# Patient Record
Sex: Male | Born: 1960 | Race: White | Hispanic: No | Marital: Married
Health system: Southern US, Community
[De-identification: ages and names within clinical notes are randomized; demographics above are authoritative.]

## PROBLEM LIST (undated history)

## (undated) DIAGNOSIS — I10 Essential (primary) hypertension: Secondary | ICD-10-CM

## (undated) DIAGNOSIS — M109 Gout, unspecified: Secondary | ICD-10-CM

---

## 2015-12-20 DIAGNOSIS — I10 Essential (primary) hypertension: Secondary | ICD-10-CM | POA: Diagnosis not present

## 2016-02-14 DIAGNOSIS — M25561 Pain in right knee: Secondary | ICD-10-CM | POA: Diagnosis not present

## 2016-02-14 DIAGNOSIS — M25562 Pain in left knee: Secondary | ICD-10-CM | POA: Diagnosis not present

## 2016-02-14 DIAGNOSIS — M25461 Effusion, right knee: Secondary | ICD-10-CM | POA: Diagnosis not present

## 2016-05-05 DIAGNOSIS — M25561 Pain in right knee: Secondary | ICD-10-CM | POA: Diagnosis not present

## 2016-05-05 DIAGNOSIS — M25461 Effusion, right knee: Secondary | ICD-10-CM | POA: Diagnosis not present

## 2016-07-14 DIAGNOSIS — Z0000 Encounter for general adult medical examination without abnormal findings: Secondary | ICD-10-CM | POA: Diagnosis not present

## 2016-07-14 DIAGNOSIS — Z125 Encounter for screening for malignant neoplasm of prostate: Secondary | ICD-10-CM | POA: Diagnosis not present

## 2016-07-14 DIAGNOSIS — E119 Type 2 diabetes mellitus without complications: Secondary | ICD-10-CM | POA: Diagnosis not present

## 2016-07-17 DIAGNOSIS — C4491 Basal cell carcinoma of skin, unspecified: Secondary | ICD-10-CM | POA: Diagnosis not present

## 2016-07-17 DIAGNOSIS — Z23 Encounter for immunization: Secondary | ICD-10-CM | POA: Diagnosis not present

## 2016-07-17 DIAGNOSIS — E119 Type 2 diabetes mellitus without complications: Secondary | ICD-10-CM | POA: Diagnosis not present

## 2016-07-17 DIAGNOSIS — I10 Essential (primary) hypertension: Secondary | ICD-10-CM | POA: Diagnosis not present

## 2016-07-17 DIAGNOSIS — Z0001 Encounter for general adult medical examination with abnormal findings: Secondary | ICD-10-CM | POA: Diagnosis not present

## 2016-07-28 DIAGNOSIS — Z1211 Encounter for screening for malignant neoplasm of colon: Secondary | ICD-10-CM | POA: Diagnosis not present

## 2016-07-29 DIAGNOSIS — M79672 Pain in left foot: Secondary | ICD-10-CM | POA: Diagnosis not present

## 2016-07-29 DIAGNOSIS — M25521 Pain in right elbow: Secondary | ICD-10-CM | POA: Diagnosis not present

## 2016-08-04 DIAGNOSIS — M7732 Calcaneal spur, left foot: Secondary | ICD-10-CM | POA: Diagnosis not present

## 2016-08-04 DIAGNOSIS — M79672 Pain in left foot: Secondary | ICD-10-CM | POA: Diagnosis not present

## 2016-08-08 DIAGNOSIS — K635 Polyp of colon: Secondary | ICD-10-CM | POA: Diagnosis not present

## 2016-08-08 DIAGNOSIS — Z1211 Encounter for screening for malignant neoplasm of colon: Secondary | ICD-10-CM | POA: Diagnosis not present

## 2016-08-12 DIAGNOSIS — K635 Polyp of colon: Secondary | ICD-10-CM | POA: Diagnosis not present

## 2017-02-17 ENCOUNTER — Emergency Department (HOSPITAL_COMMUNITY): Payer: BLUE CROSS/BLUE SHIELD

## 2017-02-17 ENCOUNTER — Other Ambulatory Visit: Payer: Self-pay

## 2017-02-17 ENCOUNTER — Encounter (HOSPITAL_COMMUNITY): Payer: Self-pay | Admitting: Emergency Medicine

## 2017-02-17 ENCOUNTER — Emergency Department (HOSPITAL_COMMUNITY)
Admission: EM | Admit: 2017-02-17 | Discharge: 2017-02-17 | Disposition: A | Discharge status: Home / Self Care | Payer: BLUE CROSS/BLUE SHIELD | Attending: Emergency Medicine | Admitting: Emergency Medicine

## 2017-02-17 DIAGNOSIS — Y9389 Activity, other specified: Secondary | ICD-10-CM | POA: Insufficient documentation

## 2017-02-17 DIAGNOSIS — Y998 Other external cause status: Secondary | ICD-10-CM | POA: Insufficient documentation

## 2017-02-17 DIAGNOSIS — S41111A Laceration without foreign body of right upper arm, initial encounter: Secondary | ICD-10-CM | POA: Diagnosis not present

## 2017-02-17 DIAGNOSIS — Y929 Unspecified place or not applicable: Secondary | ICD-10-CM | POA: Diagnosis not present

## 2017-02-17 DIAGNOSIS — S51811A Laceration without foreign body of right forearm, initial encounter: Secondary | ICD-10-CM | POA: Diagnosis not present

## 2017-02-17 HISTORY — DX: Gout, unspecified: M10.9

## 2017-02-17 HISTORY — DX: Essential (primary) hypertension: I10

## 2017-02-17 LAB — CBC WITH DIFFERENTIAL/PLATELET
BASOS PCT: 0 %
Basophils Absolute: 0 10*3/uL (ref 0.0–0.1)
EOS ABS: 0.3 10*3/uL (ref 0.0–0.7)
Eosinophils Relative: 2 %
HCT: 35.2 % — ABNORMAL LOW (ref 39.0–52.0)
Hemoglobin: 11.4 g/dL — ABNORMAL LOW (ref 13.0–17.0)
Lymphocytes Relative: 17 %
Lymphs Abs: 2.1 10*3/uL (ref 0.7–4.0)
MCH: 27.9 pg (ref 26.0–34.0)
MCHC: 32.4 g/dL (ref 30.0–36.0)
MCV: 86.3 fL (ref 78.0–100.0)
MONO ABS: 0.7 10*3/uL (ref 0.1–1.0)
MONOS PCT: 6 %
Neutro Abs: 9.6 10*3/uL — ABNORMAL HIGH (ref 1.7–7.7)
Neutrophils Relative %: 75 %
Platelets: 250 10*3/uL (ref 150–400)
RBC: 4.08 MIL/uL — ABNORMAL LOW (ref 4.22–5.81)
RDW: 14 % (ref 11.5–15.5)
WBC: 12.8 10*3/uL — ABNORMAL HIGH (ref 4.0–10.5)

## 2017-02-17 LAB — BASIC METABOLIC PANEL
Anion gap: 11 (ref 5–15)
BUN: 20 mg/dL (ref 6–20)
CALCIUM: 8.2 mg/dL — AB (ref 8.9–10.3)
CO2: 17 mmol/L — AB (ref 22–32)
CREATININE: 1.32 mg/dL — AB (ref 0.61–1.24)
Chloride: 106 mmol/L (ref 101–111)
GFR calc non Af Amer: 59 mL/min — ABNORMAL LOW (ref >60)
Glucose, Bld: 346 mg/dL — ABNORMAL HIGH (ref 65–99)
Potassium: 3.8 mmol/L (ref 3.5–5.1)
Sodium: 134 mmol/L — ABNORMAL LOW (ref 135–145)

## 2017-02-17 LAB — I-STAT CHEM 8, ED
BUN: 23 mg/dL — AB (ref 6–20)
CALCIUM ION: 1.21 mmol/L (ref 1.15–1.40)
CHLORIDE: 102 mmol/L (ref 101–111)
Creatinine, Ser: 1.3 mg/dL — ABNORMAL HIGH (ref 0.61–1.24)
Glucose, Bld: 356 mg/dL — ABNORMAL HIGH (ref 65–99)
HEMATOCRIT: 34 % — AB (ref 39.0–52.0)
Hemoglobin: 11.6 g/dL — ABNORMAL LOW (ref 13.0–17.0)
Potassium: 3.8 mmol/L (ref 3.5–5.1)
SODIUM: 138 mmol/L (ref 135–145)
TCO2: 24 mmol/L (ref 22–32)

## 2017-02-17 LAB — HEMOGLOBIN AND HEMATOCRIT, BLOOD
HCT: 32.3 % — ABNORMAL LOW (ref 39.0–52.0)
HEMOGLOBIN: 10.5 g/dL — AB (ref 13.0–17.0)

## 2017-02-17 LAB — PROTIME-INR
INR: 0.97
Prothrombin Time: 12.8 seconds (ref 11.4–15.2)

## 2017-02-17 LAB — TYPE AND SCREEN
ABO/RH(D): A POS
Antibody Screen: NEGATIVE

## 2017-02-17 LAB — ABO/RH: ABO/RH(D): A POS

## 2017-02-17 MED ORDER — HYDROCODONE-ACETAMINOPHEN 5-325 MG PO TABS: 2.0000 | ORAL_TABLET | ORAL | 0 refills | Status: AC | PRN

## 2017-02-17 MED ORDER — SODIUM CHLORIDE 0.9 % IV SOLN
INTRAVENOUS | Status: AC | PRN
  Administered 2017-02-17: 10 mL/h via INTRAVENOUS

## 2017-02-17 MED ORDER — FENTANYL CITRATE (PF) 100 MCG/2ML IJ SOLN
50.0000 ug | Freq: Once | INTRAMUSCULAR | Status: AC
  Administered 2017-02-17: 50 ug via INTRAVENOUS
  Filled 2017-02-17: qty 2

## 2017-02-17 MED ORDER — CEFAZOLIN SODIUM-DEXTROSE 2-4 GM/100ML-% IV SOLN
2.0000 g | Freq: Once | INTRAVENOUS | Status: AC
  Administered 2017-02-17: 2 g via INTRAVENOUS
  Filled 2017-02-17: qty 100

## 2017-02-17 MED ORDER — CLINDAMYCIN HCL 300 MG PO CAPS: 300.0000 mg | ORAL_CAPSULE | Freq: Three times a day (TID) | ORAL | 0 refills | Status: AC

## 2017-02-17 MED ORDER — IOPAMIDOL (ISOVUE-370) INJECTION 76%
INTRAVENOUS | Status: AC
  Administered 2017-02-17: 100 mL
  Filled 2017-02-17: qty 100

## 2017-02-17 MED ORDER — OXYCODONE-ACETAMINOPHEN 5-325 MG PO TABS
2.0000 | ORAL_TABLET | Freq: Once | ORAL | Status: AC
  Administered 2017-02-17: 2 via ORAL
  Filled 2017-02-17: qty 2

## 2017-02-17 MED ORDER — LIDOCAINE-EPINEPHRINE (PF) 2 %-1:200000 IJ SOLN
20.0000 mL | Freq: Once | INTRAMUSCULAR | Status: AC
  Administered 2017-02-17: 20 mL
  Filled 2017-02-17: qty 20

## 2017-02-17 NOTE — ED Notes (Signed)
Patient transported to CT 
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
## 2017-02-17 NOTE — ED Notes (Signed)
Family at beside. Family given emotional support. 

## 2017-02-17 NOTE — ED Notes (Signed)
[employer] at bedside 

## 2017-02-17 NOTE — Discharge Instructions (Signed)
Follow up with Dr. Ave Filterhandler for suture removal in 1 week. You likely need to have repair of your biceps muscle. Return to the ED if you develop new or worsening symptoms.

## 2017-02-17 NOTE — ED Notes (Signed)
US IV access attempted x2 by this RN and X2 by Dr. Manus Gunningancour. Unsuccessful. IV team consulted placed.

## 2017-02-17 NOTE — ED Notes (Signed)
Given paper scrubs, socks, coke. Tolerating well. Taken to car in w/c.

## 2017-02-17 NOTE — ED Notes (Signed)
MD at bedside to suture.
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
## 2017-02-17 NOTE — ED Notes (Signed)
Pt next to go to CT.

## 2017-02-23 DIAGNOSIS — M25521 Pain in right elbow: Secondary | ICD-10-CM | POA: Diagnosis not present

## 2017-03-03 DIAGNOSIS — M25521 Pain in right elbow: Secondary | ICD-10-CM | POA: Diagnosis not present

## 2017-03-07 DIAGNOSIS — 419620001 Death: Secondary | SNOMED CT | POA: Diagnosis not present

## 2017-03-07 DEATH — deceased

## 2017-03-24 DIAGNOSIS — M25521 Pain in right elbow: Secondary | ICD-10-CM | POA: Diagnosis not present

## 2017-04-21 DIAGNOSIS — M25521 Pain in right elbow: Secondary | ICD-10-CM | POA: Diagnosis not present

## 2017-07-16 DIAGNOSIS — Z125 Encounter for screening for malignant neoplasm of prostate: Secondary | ICD-10-CM | POA: Diagnosis not present

## 2017-07-16 DIAGNOSIS — E119 Type 2 diabetes mellitus without complications: Secondary | ICD-10-CM | POA: Diagnosis not present

## 2017-07-16 DIAGNOSIS — Z0000 Encounter for general adult medical examination without abnormal findings: Secondary | ICD-10-CM | POA: Diagnosis not present

## 2017-07-30 DIAGNOSIS — I10 Essential (primary) hypertension: Secondary | ICD-10-CM | POA: Diagnosis not present

## 2017-07-30 DIAGNOSIS — Z0001 Encounter for general adult medical examination with abnormal findings: Secondary | ICD-10-CM | POA: Diagnosis not present

## 2017-07-30 DIAGNOSIS — E119 Type 2 diabetes mellitus without complications: Secondary | ICD-10-CM | POA: Diagnosis not present

## 2017-07-30 DIAGNOSIS — C4491 Basal cell carcinoma of skin, unspecified: Secondary | ICD-10-CM | POA: Diagnosis not present

## 2017-10-22 DIAGNOSIS — I10 Essential (primary) hypertension: Secondary | ICD-10-CM | POA: Diagnosis not present

## 2017-10-22 DIAGNOSIS — E119 Type 2 diabetes mellitus without complications: Secondary | ICD-10-CM | POA: Diagnosis not present

## 2017-10-29 DIAGNOSIS — I10 Essential (primary) hypertension: Secondary | ICD-10-CM | POA: Diagnosis not present

## 2017-10-29 DIAGNOSIS — E119 Type 2 diabetes mellitus without complications: Secondary | ICD-10-CM | POA: Diagnosis not present

## 2017-11-09 DIAGNOSIS — E119 Type 2 diabetes mellitus without complications: Secondary | ICD-10-CM | POA: Diagnosis not present

## 2017-11-09 DIAGNOSIS — Z6836 Body mass index (BMI) 36.0-36.9, adult: Secondary | ICD-10-CM | POA: Diagnosis not present

## 2017-11-09 DIAGNOSIS — I10 Essential (primary) hypertension: Secondary | ICD-10-CM | POA: Diagnosis not present

## 2018-10-28 DIAGNOSIS — I10 Essential (primary) hypertension: Secondary | ICD-10-CM | POA: Diagnosis not present

## 2018-10-28 DIAGNOSIS — E559 Vitamin D deficiency, unspecified: Secondary | ICD-10-CM | POA: Diagnosis not present

## 2018-10-28 DIAGNOSIS — E119 Type 2 diabetes mellitus without complications: Secondary | ICD-10-CM | POA: Diagnosis not present

## 2018-11-04 DIAGNOSIS — R1310 Dysphagia, unspecified: Secondary | ICD-10-CM | POA: Diagnosis not present

## 2018-11-04 DIAGNOSIS — E118 Type 2 diabetes mellitus with unspecified complications: Secondary | ICD-10-CM | POA: Diagnosis not present

## 2018-11-11 DIAGNOSIS — Z1159 Encounter for screening for other viral diseases: Secondary | ICD-10-CM | POA: Diagnosis not present

## 2018-11-11 DIAGNOSIS — R1310 Dysphagia, unspecified: Secondary | ICD-10-CM | POA: Diagnosis not present

## 2018-12-16 DIAGNOSIS — R1310 Dysphagia, unspecified: Secondary | ICD-10-CM | POA: Diagnosis not present

## 2018-12-16 DIAGNOSIS — Z01818 Encounter for other preprocedural examination: Secondary | ICD-10-CM | POA: Diagnosis not present

## 2018-12-16 DIAGNOSIS — E119 Type 2 diabetes mellitus without complications: Secondary | ICD-10-CM | POA: Diagnosis not present

## 2018-12-22 DIAGNOSIS — K2970 Gastritis, unspecified, without bleeding: Secondary | ICD-10-CM | POA: Diagnosis not present

## 2019-01-12 DIAGNOSIS — K209 Esophagitis, unspecified without bleeding: Secondary | ICD-10-CM | POA: Diagnosis not present

## 2019-01-12 DIAGNOSIS — K222 Esophageal obstruction: Secondary | ICD-10-CM | POA: Diagnosis not present

## 2019-04-05 DIAGNOSIS — E119 Type 2 diabetes mellitus without complications: Secondary | ICD-10-CM | POA: Diagnosis not present

## 2019-04-05 DIAGNOSIS — Z0000 Encounter for general adult medical examination without abnormal findings: Secondary | ICD-10-CM | POA: Diagnosis not present

## 2019-04-05 DIAGNOSIS — I10 Essential (primary) hypertension: Secondary | ICD-10-CM | POA: Diagnosis not present

## 2019-04-05 DIAGNOSIS — Z125 Encounter for screening for malignant neoplasm of prostate: Secondary | ICD-10-CM | POA: Diagnosis not present

## 2019-04-05 DIAGNOSIS — Z1159 Encounter for screening for other viral diseases: Secondary | ICD-10-CM | POA: Diagnosis not present

## 2019-04-13 DIAGNOSIS — I10 Essential (primary) hypertension: Secondary | ICD-10-CM | POA: Diagnosis not present

## 2019-04-13 DIAGNOSIS — Z1212 Encounter for screening for malignant neoplasm of rectum: Secondary | ICD-10-CM | POA: Diagnosis not present

## 2019-04-13 DIAGNOSIS — Z0001 Encounter for general adult medical examination with abnormal findings: Secondary | ICD-10-CM | POA: Diagnosis not present

## 2019-12-16 DIAGNOSIS — M109 Gout, unspecified: Secondary | ICD-10-CM | POA: Diagnosis not present

## 2020-04-11 DIAGNOSIS — Z125 Encounter for screening for malignant neoplasm of prostate: Secondary | ICD-10-CM | POA: Diagnosis not present

## 2020-04-11 DIAGNOSIS — I10 Essential (primary) hypertension: Secondary | ICD-10-CM | POA: Diagnosis not present

## 2020-04-11 DIAGNOSIS — Z0000 Encounter for general adult medical examination without abnormal findings: Secondary | ICD-10-CM | POA: Diagnosis not present

## 2020-04-16 DIAGNOSIS — G43009 Migraine without aura, not intractable, without status migrainosus: Secondary | ICD-10-CM | POA: Diagnosis not present

## 2020-04-16 DIAGNOSIS — Z23 Encounter for immunization: Secondary | ICD-10-CM | POA: Diagnosis not present

## 2020-04-16 DIAGNOSIS — I10 Essential (primary) hypertension: Secondary | ICD-10-CM | POA: Diagnosis not present

## 2020-04-16 DIAGNOSIS — E118 Type 2 diabetes mellitus with unspecified complications: Secondary | ICD-10-CM | POA: Diagnosis not present

## 2020-04-16 DIAGNOSIS — Z0001 Encounter for general adult medical examination with abnormal findings: Secondary | ICD-10-CM | POA: Diagnosis not present

## 2020-05-08 DIAGNOSIS — E119 Type 2 diabetes mellitus without complications: Secondary | ICD-10-CM | POA: Diagnosis not present

## 2020-05-08 DIAGNOSIS — I10 Essential (primary) hypertension: Secondary | ICD-10-CM | POA: Diagnosis not present

## 2020-05-08 DIAGNOSIS — E7800 Pure hypercholesterolemia, unspecified: Secondary | ICD-10-CM | POA: Diagnosis not present

## 2020-08-07 DIAGNOSIS — I10 Essential (primary) hypertension: Secondary | ICD-10-CM | POA: Diagnosis not present

## 2020-08-08 DIAGNOSIS — E7800 Pure hypercholesterolemia, unspecified: Secondary | ICD-10-CM | POA: Diagnosis not present

## 2020-08-08 DIAGNOSIS — E119 Type 2 diabetes mellitus without complications: Secondary | ICD-10-CM | POA: Diagnosis not present

## 2020-08-09 DIAGNOSIS — E119 Type 2 diabetes mellitus without complications: Secondary | ICD-10-CM | POA: Diagnosis not present

## 2020-08-09 DIAGNOSIS — E7800 Pure hypercholesterolemia, unspecified: Secondary | ICD-10-CM | POA: Diagnosis not present

## 2020-08-09 DIAGNOSIS — I10 Essential (primary) hypertension: Secondary | ICD-10-CM | POA: Diagnosis not present

## 2020-09-25 DIAGNOSIS — H18413 Arcus senilis, bilateral: Secondary | ICD-10-CM | POA: Diagnosis not present

## 2020-09-25 DIAGNOSIS — H25013 Cortical age-related cataract, bilateral: Secondary | ICD-10-CM | POA: Diagnosis not present

## 2020-09-25 DIAGNOSIS — H2511 Age-related nuclear cataract, right eye: Secondary | ICD-10-CM | POA: Diagnosis not present

## 2020-09-25 DIAGNOSIS — H25043 Posterior subcapsular polar age-related cataract, bilateral: Secondary | ICD-10-CM | POA: Diagnosis not present

## 2020-09-25 DIAGNOSIS — H2513 Age-related nuclear cataract, bilateral: Secondary | ICD-10-CM | POA: Diagnosis not present

## 2020-11-14 DIAGNOSIS — H2511 Age-related nuclear cataract, right eye: Secondary | ICD-10-CM | POA: Diagnosis not present

## 2020-11-15 DIAGNOSIS — H2512 Age-related nuclear cataract, left eye: Secondary | ICD-10-CM | POA: Diagnosis not present

## 2020-11-28 DIAGNOSIS — H2512 Age-related nuclear cataract, left eye: Secondary | ICD-10-CM | POA: Diagnosis not present

## 2020-11-28 DIAGNOSIS — H2513 Age-related nuclear cataract, bilateral: Secondary | ICD-10-CM | POA: Diagnosis not present

## 2021-04-22 ENCOUNTER — Other Ambulatory Visit (HOSPITAL_COMMUNITY): Payer: Self-pay

## 2021-04-22 DIAGNOSIS — E119 Type 2 diabetes mellitus without complications: Secondary | ICD-10-CM | POA: Diagnosis not present

## 2021-04-22 DIAGNOSIS — Z125 Encounter for screening for malignant neoplasm of prostate: Secondary | ICD-10-CM | POA: Diagnosis not present

## 2021-04-22 DIAGNOSIS — Z0000 Encounter for general adult medical examination without abnormal findings: Secondary | ICD-10-CM | POA: Diagnosis not present

## 2021-04-22 MED ORDER — OZEMPIC (0.25 OR 0.5 MG/DOSE) 2 MG/1.5ML ~~LOC~~ SOPN
PEN_INJECTOR | SUBCUTANEOUS | 5 refills | Status: DC
  Filled 2021-04-22: qty 1.5, 28d supply, fill #0
  Filled 2021-06-13: qty 1.5, 28d supply, fill #1
  Filled 2021-07-23: qty 1.5, 28d supply, fill #2

## 2021-04-23 DIAGNOSIS — E7800 Pure hypercholesterolemia, unspecified: Secondary | ICD-10-CM | POA: Diagnosis not present

## 2021-04-23 DIAGNOSIS — Z0000 Encounter for general adult medical examination without abnormal findings: Secondary | ICD-10-CM | POA: Diagnosis not present

## 2021-04-23 DIAGNOSIS — E118 Type 2 diabetes mellitus with unspecified complications: Secondary | ICD-10-CM | POA: Diagnosis not present

## 2021-04-23 DIAGNOSIS — B351 Tinea unguium: Secondary | ICD-10-CM | POA: Diagnosis not present

## 2021-04-23 DIAGNOSIS — K2100 Gastro-esophageal reflux disease with esophagitis, without bleeding: Secondary | ICD-10-CM | POA: Diagnosis not present

## 2021-04-23 DIAGNOSIS — Z23 Encounter for immunization: Secondary | ICD-10-CM | POA: Diagnosis not present

## 2021-04-23 DIAGNOSIS — I10 Essential (primary) hypertension: Secondary | ICD-10-CM | POA: Diagnosis not present

## 2021-04-24 ENCOUNTER — Other Ambulatory Visit: Payer: Self-pay | Admitting: Internal Medicine

## 2021-04-24 ENCOUNTER — Other Ambulatory Visit (HOSPITAL_COMMUNITY): Payer: Self-pay

## 2021-04-24 DIAGNOSIS — I10 Essential (primary) hypertension: Secondary | ICD-10-CM

## 2021-05-08 ENCOUNTER — Ambulatory Visit
Admission: RE | Admit: 2021-05-08 | Discharge: 2021-05-08 | Disposition: A | Discharge status: Home / Self Care | Payer: Self-pay | Source: Ambulatory Visit | Attending: Internal Medicine | Admitting: Internal Medicine

## 2021-05-08 DIAGNOSIS — I10 Essential (primary) hypertension: Secondary | ICD-10-CM

## 2021-05-21 DIAGNOSIS — M25551 Pain in right hip: Secondary | ICD-10-CM | POA: Diagnosis not present

## 2021-05-21 DIAGNOSIS — M5136 Other intervertebral disc degeneration, lumbar region: Secondary | ICD-10-CM | POA: Diagnosis not present

## 2021-05-21 DIAGNOSIS — M47812 Spondylosis without myelopathy or radiculopathy, cervical region: Secondary | ICD-10-CM | POA: Diagnosis not present

## 2021-05-23 DIAGNOSIS — M9903 Segmental and somatic dysfunction of lumbar region: Secondary | ICD-10-CM | POA: Diagnosis not present

## 2021-05-23 DIAGNOSIS — M9905 Segmental and somatic dysfunction of pelvic region: Secondary | ICD-10-CM | POA: Diagnosis not present

## 2021-05-23 DIAGNOSIS — M9901 Segmental and somatic dysfunction of cervical region: Secondary | ICD-10-CM | POA: Diagnosis not present

## 2021-05-23 DIAGNOSIS — M9904 Segmental and somatic dysfunction of sacral region: Secondary | ICD-10-CM | POA: Diagnosis not present

## 2021-05-30 DIAGNOSIS — M9903 Segmental and somatic dysfunction of lumbar region: Secondary | ICD-10-CM | POA: Diagnosis not present

## 2021-05-30 DIAGNOSIS — M9901 Segmental and somatic dysfunction of cervical region: Secondary | ICD-10-CM | POA: Diagnosis not present

## 2021-05-30 DIAGNOSIS — M9904 Segmental and somatic dysfunction of sacral region: Secondary | ICD-10-CM | POA: Diagnosis not present

## 2021-05-30 DIAGNOSIS — M9905 Segmental and somatic dysfunction of pelvic region: Secondary | ICD-10-CM | POA: Diagnosis not present

## 2021-06-13 ENCOUNTER — Other Ambulatory Visit (HOSPITAL_COMMUNITY): Payer: Self-pay

## 2021-06-17 ENCOUNTER — Other Ambulatory Visit (HOSPITAL_COMMUNITY): Payer: Self-pay

## 2021-07-23 ENCOUNTER — Other Ambulatory Visit (HOSPITAL_COMMUNITY): Payer: Self-pay

## 2021-07-23 DIAGNOSIS — I2510 Atherosclerotic heart disease of native coronary artery without angina pectoris: Secondary | ICD-10-CM | POA: Diagnosis not present

## 2021-07-23 DIAGNOSIS — E7800 Pure hypercholesterolemia, unspecified: Secondary | ICD-10-CM | POA: Diagnosis not present

## 2021-07-23 DIAGNOSIS — E119 Type 2 diabetes mellitus without complications: Secondary | ICD-10-CM | POA: Diagnosis not present

## 2021-07-23 DIAGNOSIS — I10 Essential (primary) hypertension: Secondary | ICD-10-CM | POA: Diagnosis not present

## 2021-07-23 DIAGNOSIS — E870 Hyperosmolality and hypernatremia: Secondary | ICD-10-CM | POA: Diagnosis not present

## 2021-07-23 MED ORDER — OZEMPIC (0.25 OR 0.5 MG/DOSE) 2 MG/3ML ~~LOC~~ SOPN
PEN_INJECTOR | SUBCUTANEOUS | 2 refills | Status: AC
  Filled 2021-07-23: qty 3, 28d supply, fill #0
  Filled 2021-09-03 – 2022-03-25 (×2): qty 3, 28d supply, fill #1

## 2021-07-25 ENCOUNTER — Other Ambulatory Visit (HOSPITAL_COMMUNITY): Payer: Self-pay

## 2021-07-25 MED ORDER — OZEMPIC (0.25 OR 0.5 MG/DOSE) 2 MG/3ML ~~LOC~~ SOPN
PEN_INJECTOR | SUBCUTANEOUS | 3 refills | Status: AC
  Filled 2021-07-25: qty 9, 84d supply, fill #0
  Filled 2021-09-04: qty 3, 28d supply, fill #0
  Filled 2021-10-10: qty 3, 28d supply, fill #1
  Filled 2021-11-18: qty 3, 28d supply, fill #2
  Filled 2022-01-06: qty 3, 28d supply, fill #3

## 2021-09-04 ENCOUNTER — Other Ambulatory Visit (HOSPITAL_COMMUNITY): Payer: Self-pay

## 2021-10-10 ENCOUNTER — Other Ambulatory Visit (HOSPITAL_COMMUNITY): Payer: Self-pay

## 2021-10-16 ENCOUNTER — Other Ambulatory Visit (HOSPITAL_COMMUNITY): Payer: Self-pay

## 2021-11-18 ENCOUNTER — Other Ambulatory Visit (HOSPITAL_COMMUNITY): Payer: Self-pay

## 2021-11-20 ENCOUNTER — Other Ambulatory Visit (HOSPITAL_COMMUNITY): Payer: Self-pay

## 2022-01-01 DIAGNOSIS — K209 Esophagitis, unspecified without bleeding: Secondary | ICD-10-CM | POA: Diagnosis not present

## 2022-01-01 DIAGNOSIS — K222 Esophageal obstruction: Secondary | ICD-10-CM | POA: Diagnosis not present

## 2022-01-01 DIAGNOSIS — Z86010 Personal history of colonic polyps: Secondary | ICD-10-CM | POA: Diagnosis not present

## 2022-01-06 ENCOUNTER — Other Ambulatory Visit (HOSPITAL_COMMUNITY): Payer: Self-pay

## 2022-01-08 ENCOUNTER — Other Ambulatory Visit (HOSPITAL_COMMUNITY): Payer: Self-pay

## 2022-01-08 DIAGNOSIS — I2510 Atherosclerotic heart disease of native coronary artery without angina pectoris: Secondary | ICD-10-CM | POA: Diagnosis not present

## 2022-01-08 DIAGNOSIS — I10 Essential (primary) hypertension: Secondary | ICD-10-CM | POA: Diagnosis not present

## 2022-01-08 DIAGNOSIS — E7800 Pure hypercholesterolemia, unspecified: Secondary | ICD-10-CM | POA: Diagnosis not present

## 2022-01-08 DIAGNOSIS — E1169 Type 2 diabetes mellitus with other specified complication: Secondary | ICD-10-CM | POA: Diagnosis not present

## 2022-01-08 MED ORDER — OZEMPIC (1 MG/DOSE) 4 MG/3ML ~~LOC~~ SOPN
1.0000 mg | PEN_INJECTOR | SUBCUTANEOUS | 3 refills | Status: AC
  Filled 2022-01-08: qty 3, 28d supply, fill #0
  Filled 2022-07-29: qty 3, 28d supply, fill #1

## 2022-01-24 DIAGNOSIS — Z1211 Encounter for screening for malignant neoplasm of colon: Secondary | ICD-10-CM | POA: Diagnosis not present

## 2022-01-24 DIAGNOSIS — Z86010 Personal history of colonic polyps: Secondary | ICD-10-CM | POA: Diagnosis not present

## 2022-01-24 DIAGNOSIS — E119 Type 2 diabetes mellitus without complications: Secondary | ICD-10-CM | POA: Diagnosis not present

## 2022-02-03 DIAGNOSIS — K635 Polyp of colon: Secondary | ICD-10-CM | POA: Diagnosis not present

## 2022-03-25 ENCOUNTER — Other Ambulatory Visit (HOSPITAL_COMMUNITY): Payer: Self-pay

## 2022-03-26 ENCOUNTER — Other Ambulatory Visit (HOSPITAL_COMMUNITY): Payer: Self-pay

## 2022-03-27 ENCOUNTER — Other Ambulatory Visit (HOSPITAL_COMMUNITY): Payer: Self-pay

## 2022-04-24 DIAGNOSIS — Z0000 Encounter for general adult medical examination without abnormal findings: Secondary | ICD-10-CM | POA: Diagnosis not present

## 2022-04-24 DIAGNOSIS — E1169 Type 2 diabetes mellitus with other specified complication: Secondary | ICD-10-CM | POA: Diagnosis not present

## 2022-04-24 DIAGNOSIS — Z125 Encounter for screening for malignant neoplasm of prostate: Secondary | ICD-10-CM | POA: Diagnosis not present

## 2022-04-29 DIAGNOSIS — I2510 Atherosclerotic heart disease of native coronary artery without angina pectoris: Secondary | ICD-10-CM | POA: Diagnosis not present

## 2022-04-29 DIAGNOSIS — K2100 Gastro-esophageal reflux disease with esophagitis, without bleeding: Secondary | ICD-10-CM | POA: Diagnosis not present

## 2022-04-29 DIAGNOSIS — Z0000 Encounter for general adult medical examination without abnormal findings: Secondary | ICD-10-CM | POA: Diagnosis not present

## 2022-04-29 DIAGNOSIS — I10 Essential (primary) hypertension: Secondary | ICD-10-CM | POA: Diagnosis not present

## 2022-04-29 DIAGNOSIS — E7800 Pure hypercholesterolemia, unspecified: Secondary | ICD-10-CM | POA: Diagnosis not present

## 2022-04-29 DIAGNOSIS — E118 Type 2 diabetes mellitus with unspecified complications: Secondary | ICD-10-CM | POA: Diagnosis not present

## 2022-05-08 IMAGING — CT CT CARDIAC CORONARY ARTERY CALCIUM SCORE
3 series · 14 of 20 positions shown, 16 images · non-contrast
Comparison: None.

CLINICAL DATA: 60-year-old Caucasian male with history hypertension
and diabetes.

EXAM:
CT CARDIAC CORONARY ARTERY CALCIUM SCORE
TECHNIQUE: Non-contrast imaging through the heart was performed using
prospective ECG gating. Image post processing was performed on an
independent workstation, allowing for quantitative analysis of the
heart and coronary arteries. Note that this exam targets the heart
and the chest was not imaged in its entirety.

[Series 2: calcium scoring 2.00 qr36 bestdiast 71% hrt calciu · axial · 0.47mm/px · z∈[+1643,+1721]mm · 4 of 65 slices shown]
[im 13/65  vessel]
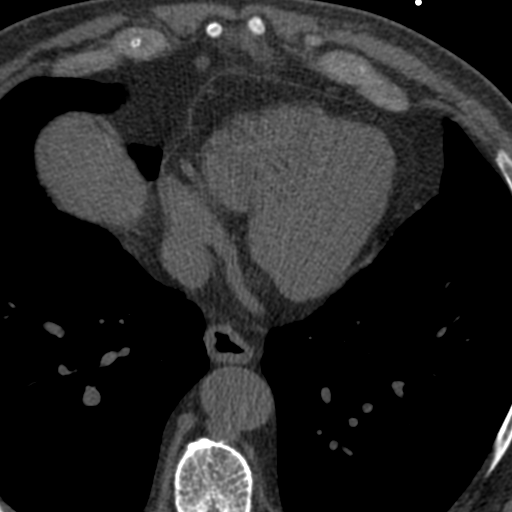
[im 26/65  vessel]
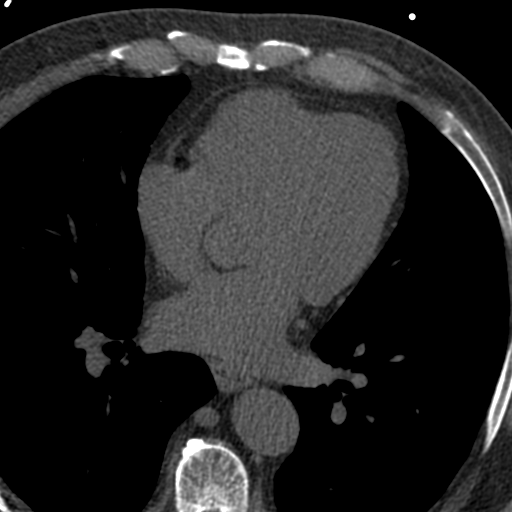
[im 39/65  vessel]
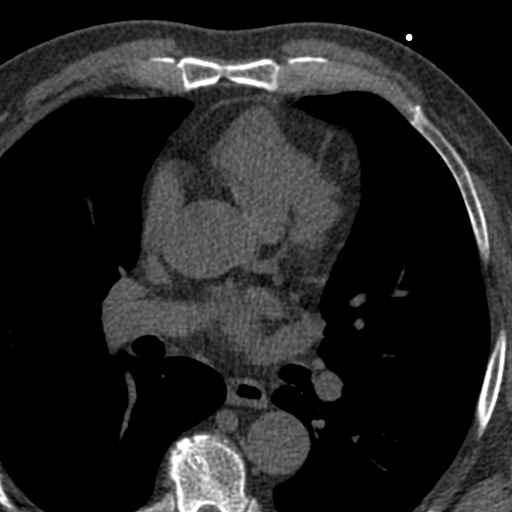
[im 52/65  vessel]
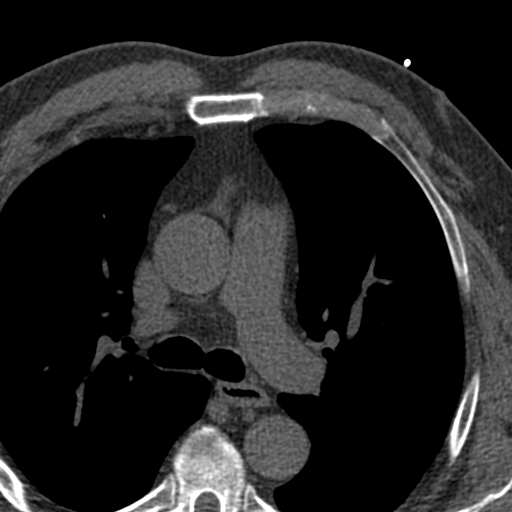

[Series 3: calcium scoring 2.00 br40 bestdiast 71% axial · axial · 0.67mm/px · z∈[+1631,+1723]mm · 5 of 70 slices shown, 7 images]
[im 12/70  vessel]
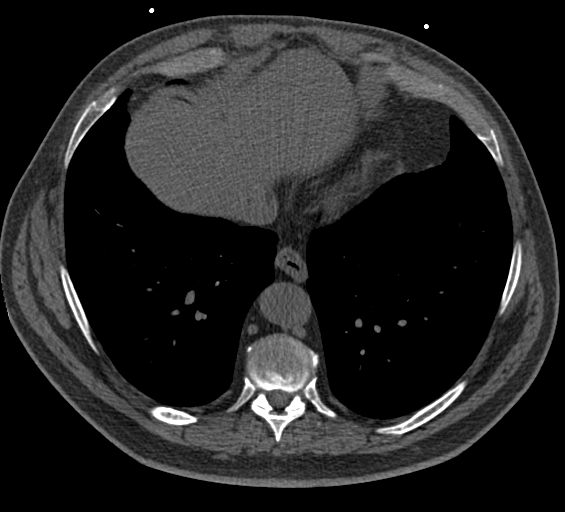
[im 12/70  lung]
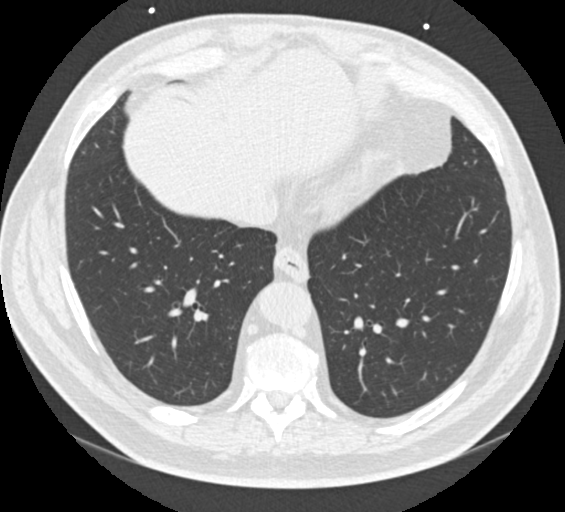
[im 24/70  vessel]
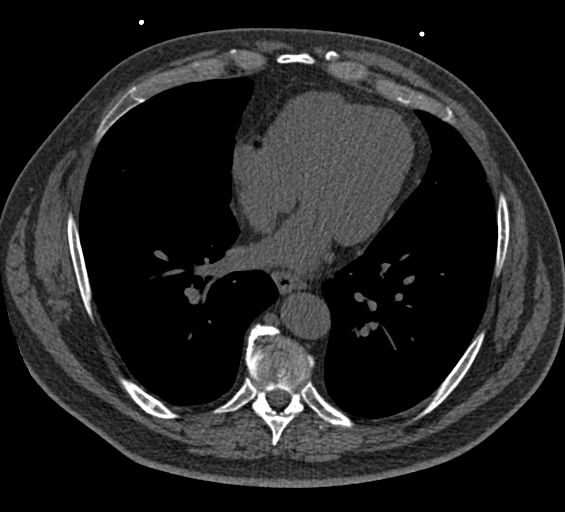
[im 35/70  vessel]
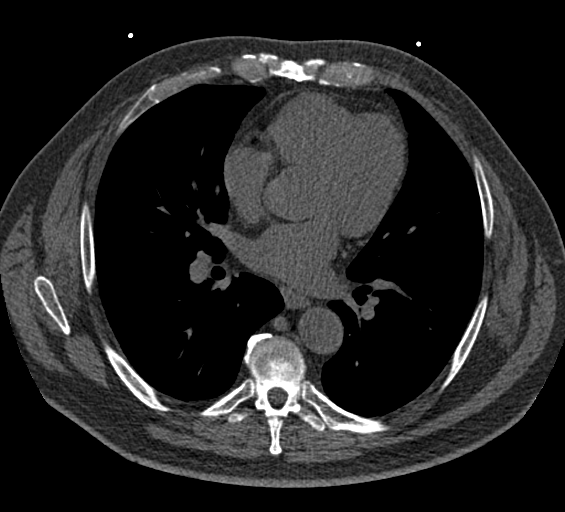
[im 47/70  vessel]
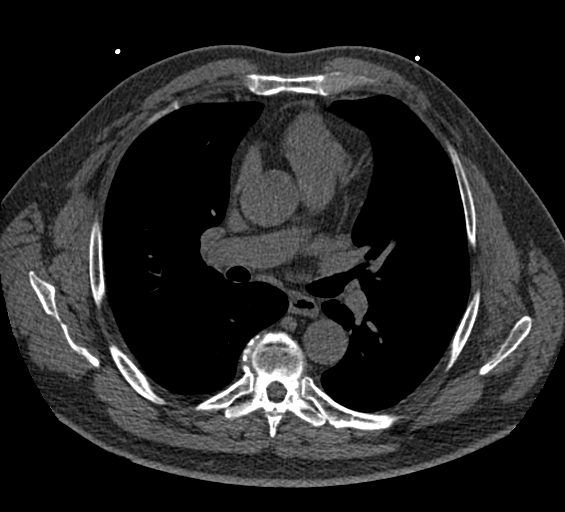
[im 58/70  vessel]
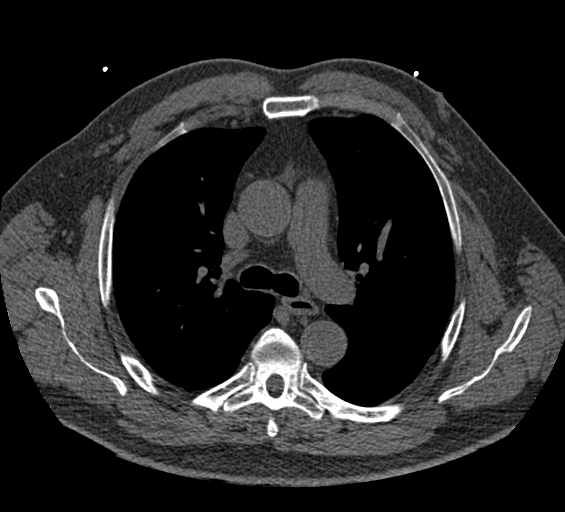
[im 58/70  lung]
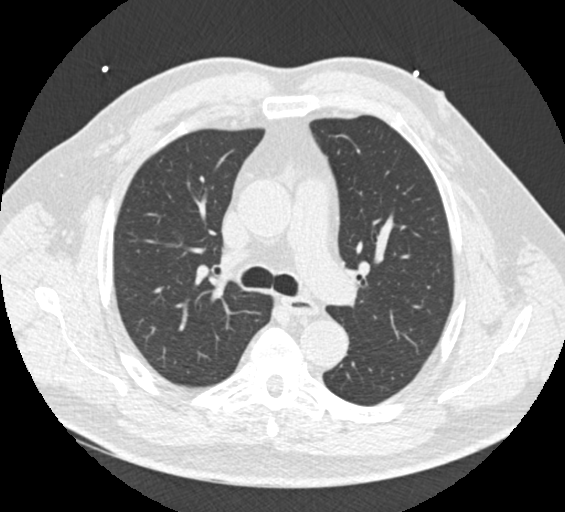

[Series 9: calcium scoring 2.00 br60 bestdiast 71% lungs · axial · 0.67mm/px · z∈[+1631,+1723]mm · 5 of 70 slices shown]
[im 12/70  vessel]
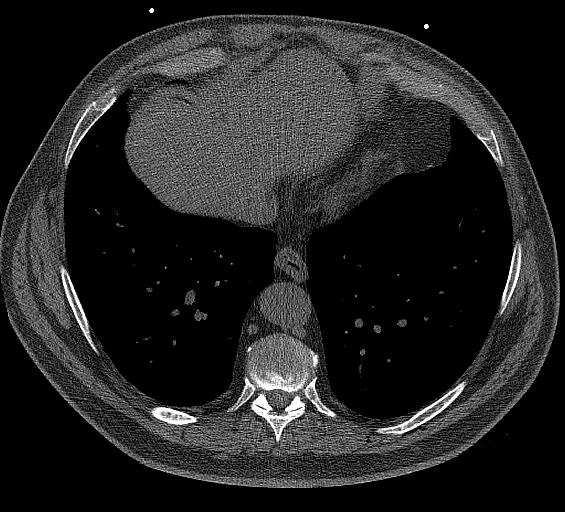
[im 24/70  vessel]
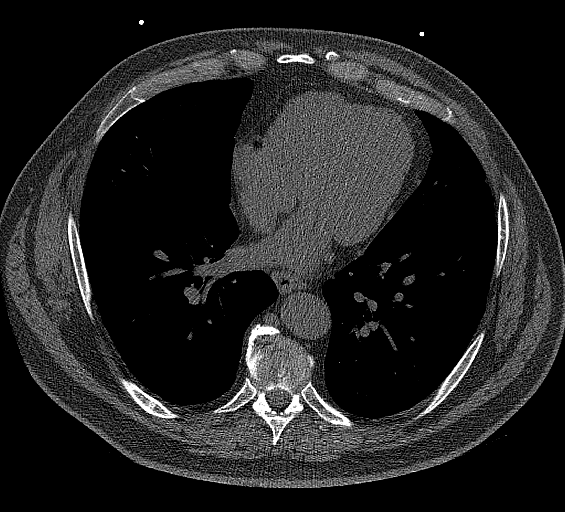
[im 35/70  vessel]
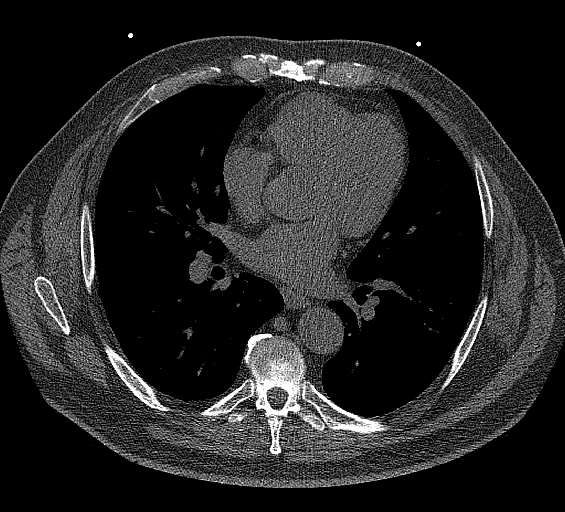
[im 47/70  vessel]
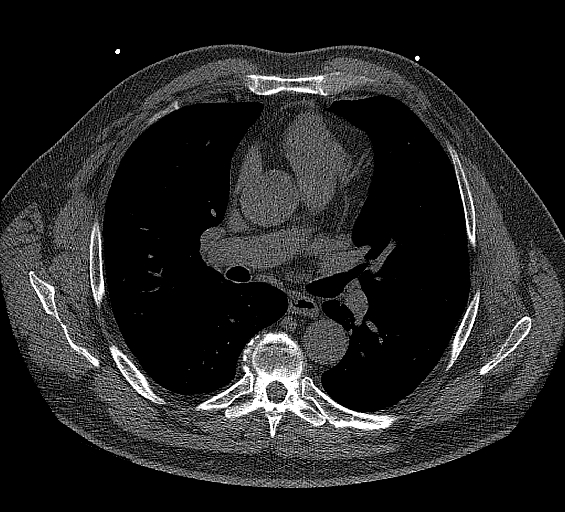
[im 58/70  vessel]
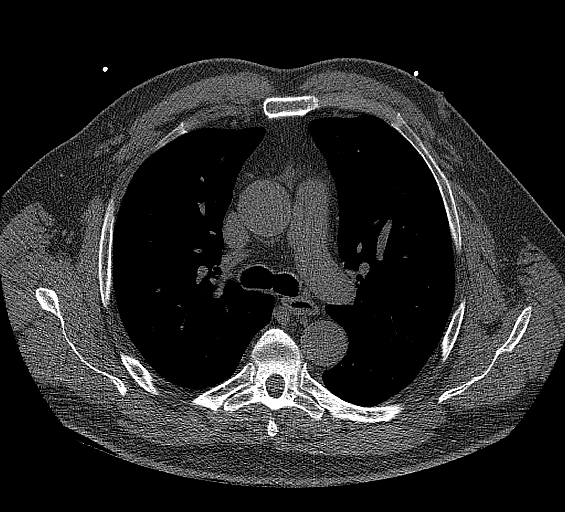

[14 of 20 positions shown; findings below may reference images not displayed]

FINDINGS: CORONARY CALCIUM SCORES:

Left Main: 0

LAD:

LCx:

RCA: 0

Total Agatston Score:

[HOSPITAL] percentile: 44

AORTA MEASUREMENTS:

Ascending Aorta: 36 mm

Descending Aorta: 28 mm

OTHER FINDINGS:

The heart size is within normal limits. No pericardial fluid is
identified. Visualized segments of the thoracic aorta and central
pulmonary arteries are normal in caliber. Visualized mediastinum and
hilar regions demonstrate no lymphadenopathy or masses. Visualized
lungs show no evidence of pulmonary edema, consolidation,
pneumothorax, nodule or pleural fluid. Visualized upper abdomen and
bony structures are unremarkable.
IMPRESSION: Coronary calcium score 17.1 is at the 44th percentile for the
patient's age, sex and race.

## 2022-07-10 DIAGNOSIS — E1169 Type 2 diabetes mellitus with other specified complication: Secondary | ICD-10-CM | POA: Diagnosis not present

## 2022-07-10 DIAGNOSIS — E7800 Pure hypercholesterolemia, unspecified: Secondary | ICD-10-CM | POA: Diagnosis not present

## 2022-07-10 DIAGNOSIS — I10 Essential (primary) hypertension: Secondary | ICD-10-CM | POA: Diagnosis not present

## 2022-07-29 ENCOUNTER — Other Ambulatory Visit (HOSPITAL_COMMUNITY): Payer: Self-pay

## 2022-07-29 ENCOUNTER — Other Ambulatory Visit: Payer: Self-pay

## 2022-11-24 DIAGNOSIS — E119 Type 2 diabetes mellitus without complications: Secondary | ICD-10-CM | POA: Diagnosis not present

## 2022-11-27 DIAGNOSIS — E1169 Type 2 diabetes mellitus with other specified complication: Secondary | ICD-10-CM | POA: Diagnosis not present

## 2022-11-27 DIAGNOSIS — E7800 Pure hypercholesterolemia, unspecified: Secondary | ICD-10-CM | POA: Diagnosis not present

## 2022-11-27 DIAGNOSIS — I10 Essential (primary) hypertension: Secondary | ICD-10-CM | POA: Diagnosis not present

## 2023-05-05 DIAGNOSIS — M25562 Pain in left knee: Secondary | ICD-10-CM | POA: Diagnosis not present

## 2023-05-25 DIAGNOSIS — Z0000 Encounter for general adult medical examination without abnormal findings: Secondary | ICD-10-CM | POA: Diagnosis not present

## 2023-05-25 DIAGNOSIS — Z125 Encounter for screening for malignant neoplasm of prostate: Secondary | ICD-10-CM | POA: Diagnosis not present

## 2023-05-25 DIAGNOSIS — I10 Essential (primary) hypertension: Secondary | ICD-10-CM | POA: Diagnosis not present

## 2023-05-25 DIAGNOSIS — E7800 Pure hypercholesterolemia, unspecified: Secondary | ICD-10-CM | POA: Diagnosis not present

## 2023-05-25 DIAGNOSIS — E119 Type 2 diabetes mellitus without complications: Secondary | ICD-10-CM | POA: Diagnosis not present

## 2023-07-14 DIAGNOSIS — E1169 Type 2 diabetes mellitus with other specified complication: Secondary | ICD-10-CM | POA: Diagnosis not present

## 2023-07-14 DIAGNOSIS — I2510 Atherosclerotic heart disease of native coronary artery without angina pectoris: Secondary | ICD-10-CM | POA: Diagnosis not present

## 2023-07-14 DIAGNOSIS — B351 Tinea unguium: Secondary | ICD-10-CM | POA: Diagnosis not present

## 2023-07-14 DIAGNOSIS — I10 Essential (primary) hypertension: Secondary | ICD-10-CM | POA: Diagnosis not present

## 2023-07-14 DIAGNOSIS — Z0000 Encounter for general adult medical examination without abnormal findings: Secondary | ICD-10-CM | POA: Diagnosis not present

## 2023-09-14 DIAGNOSIS — B351 Tinea unguium: Secondary | ICD-10-CM | POA: Diagnosis not present

## 2024-03-10 DIAGNOSIS — E1169 Type 2 diabetes mellitus with other specified complication: Secondary | ICD-10-CM | POA: Diagnosis not present

## 2024-03-10 DIAGNOSIS — I10 Essential (primary) hypertension: Secondary | ICD-10-CM | POA: Diagnosis not present

## 2024-03-10 DIAGNOSIS — E7800 Pure hypercholesterolemia, unspecified: Secondary | ICD-10-CM | POA: Diagnosis not present
